# Patient Record
Sex: Female | Born: 1945 | Hispanic: No | Smoking: Never smoker
Health system: Southern US, Community
[De-identification: ages and names within clinical notes are randomized; demographics above are authoritative.]

## PROBLEM LIST (undated history)

## (undated) DIAGNOSIS — F319 Bipolar disorder, unspecified: Secondary | ICD-10-CM

## (undated) DIAGNOSIS — K219 Gastro-esophageal reflux disease without esophagitis: Secondary | ICD-10-CM

## (undated) DIAGNOSIS — G4700 Insomnia, unspecified: Secondary | ICD-10-CM

## (undated) DIAGNOSIS — R42 Dizziness and giddiness: Secondary | ICD-10-CM

## (undated) DIAGNOSIS — R569 Unspecified convulsions: Secondary | ICD-10-CM

## (undated) DIAGNOSIS — R4681 Obsessive-compulsive behavior: Secondary | ICD-10-CM

---

## 2011-04-22 ENCOUNTER — Emergency Department (HOSPITAL_BASED_OUTPATIENT_CLINIC_OR_DEPARTMENT_OTHER)
Admission: EM | Admit: 2011-04-22 | Discharge: 2011-04-22 | Disposition: A | Discharge status: Other Acute Inpatient Hospital | Payer: Medicare Other | Attending: Emergency Medicine | Admitting: Emergency Medicine

## 2011-04-22 ENCOUNTER — Emergency Department (INDEPENDENT_AMBULATORY_CARE_PROVIDER_SITE_OTHER): Payer: Medicare Other

## 2011-04-22 ENCOUNTER — Encounter (HOSPITAL_BASED_OUTPATIENT_CLINIC_OR_DEPARTMENT_OTHER): Payer: Self-pay | Admitting: *Deleted

## 2011-04-22 DIAGNOSIS — F429 Obsessive-compulsive disorder, unspecified: Secondary | ICD-10-CM | POA: Insufficient documentation

## 2011-04-22 DIAGNOSIS — R197 Diarrhea, unspecified: Secondary | ICD-10-CM

## 2011-04-22 DIAGNOSIS — R918 Other nonspecific abnormal finding of lung field: Secondary | ICD-10-CM

## 2011-04-22 DIAGNOSIS — R05 Cough: Secondary | ICD-10-CM

## 2011-04-22 DIAGNOSIS — J984 Other disorders of lung: Secondary | ICD-10-CM | POA: Insufficient documentation

## 2011-04-22 DIAGNOSIS — F319 Bipolar disorder, unspecified: Secondary | ICD-10-CM | POA: Insufficient documentation

## 2011-04-22 DIAGNOSIS — R109 Unspecified abdominal pain: Secondary | ICD-10-CM

## 2011-04-22 DIAGNOSIS — J189 Pneumonia, unspecified organism: Secondary | ICD-10-CM | POA: Insufficient documentation

## 2011-04-22 DIAGNOSIS — R4681 Obsessive-compulsive behavior: Secondary | ICD-10-CM | POA: Insufficient documentation

## 2011-04-22 DIAGNOSIS — G4700 Insomnia, unspecified: Secondary | ICD-10-CM | POA: Insufficient documentation

## 2011-04-22 HISTORY — DX: Obsessive-compulsive behavior: R46.81

## 2011-04-22 HISTORY — DX: Bipolar disorder, unspecified: F31.9

## 2011-04-22 HISTORY — DX: Dizziness and giddiness: R42

## 2011-04-22 HISTORY — DX: Insomnia, unspecified: G47.00

## 2011-04-22 LAB — DIFFERENTIAL
Basophils Absolute: 0 10*3/uL (ref 0.0–0.1)
Eosinophils Absolute: 0.3 10*3/uL (ref 0.0–0.7)
Lymphocytes Relative: 42 % (ref 12–46)
Monocytes Relative: 11 % (ref 3–12)
Neutrophils Relative %: 43 % (ref 43–77)

## 2011-04-22 LAB — CULTURE, BLOOD (ROUTINE X 2)
Culture  Setup Time: 201301131358
Culture: NO GROWTH

## 2011-04-22 LAB — CBC
HCT: 32.8 % — ABNORMAL LOW (ref 36.0–46.0)
MCHC: 32.3 g/dL (ref 30.0–36.0)
Platelets: 270 10*3/uL (ref 150–400)
RDW: 17.1 % — ABNORMAL HIGH (ref 11.5–15.5)
WBC: 6.7 10*3/uL (ref 4.0–10.5)

## 2011-04-22 LAB — BASIC METABOLIC PANEL
Calcium: 9 mg/dL (ref 8.4–10.5)
GFR calc Af Amer: >90 mL/min (ref >90)
GFR calc non Af Amer: 88 mL/min — ABNORMAL LOW (ref >90)
Potassium: 3.6 mEq/L (ref 3.5–5.1)
Sodium: 138 mEq/L (ref 135–145)

## 2011-04-22 MED ORDER — DIPHENHYDRAMINE HCL 50 MG/ML IJ SOLN
12.5000 mg | Freq: Once | INTRAMUSCULAR | Status: AC
  Administered 2011-04-22: 12.5 mg via INTRAVENOUS

## 2011-04-22 MED ORDER — DIPHENHYDRAMINE HCL 50 MG/ML IJ SOLN
INTRAMUSCULAR | Status: AC
  Administered 2011-04-22: 12.5 mg via INTRAVENOUS
  Filled 2011-04-22: qty 1

## 2011-04-22 MED ORDER — SODIUM CHLORIDE 0.9 % IV SOLN
INTRAVENOUS | Status: DC
  Administered 2011-04-22: 06:00:00 via INTRAVENOUS

## 2011-04-22 MED ORDER — DEXTROSE 5 % IV SOLN
500.0000 mg | Freq: Once | INTRAVENOUS | Status: AC
  Administered 2011-04-22: 500 mg via INTRAVENOUS
  Filled 2011-04-22: qty 500

## 2011-04-22 MED ORDER — GI COCKTAIL ~~LOC~~
30.0000 mL | Freq: Once | ORAL | Status: AC
  Administered 2011-04-22: 30 mL via ORAL
  Filled 2011-04-22: qty 30

## 2011-04-22 MED ORDER — PIPERACILLIN-TAZOBACTAM 3.375 G IVPB
3.3750 g | Freq: Once | INTRAVENOUS | Status: AC
  Administered 2011-04-22: 3.375 g via INTRAVENOUS
  Filled 2011-04-22: qty 50

## 2011-04-22 MED ORDER — VANCOMYCIN HCL IN DEXTROSE 1-5 GM/200ML-% IV SOLN
1000.0000 mg | Freq: Once | INTRAVENOUS | Status: AC
  Administered 2011-04-22: 1000 mg via INTRAVENOUS
  Filled 2011-04-22: qty 200

## 2011-04-22 NOTE — ED Provider Notes (Addendum)
History     CSN: [id number]  Arrival date & time 04/22/11  0110   First MD Initiated Contact with Patient 04/22/11 0146      Chief Complaint  Patient presents with  . Cough    (Consider location/radiation/quality/duration/timing/severity/associated sxs/prior treatment) Patient is a 66 y.o. female presenting with cough. The history is provided by a relative. The history is limited by a language barrier. No language interpreter was used.  Cough This is a recurrent problem. The current episode started more than 1 week ago (3 weeks ago.  Recently admitted at [facility] for Pna and then discharged and was [place]). The problem occurs constantly. The problem has not changed since onset.The cough is non-productive. There has been no fever. She has tried cough syrup for the symptoms. The treatment provided no relief. Risk factors include travel to endemic areas. Her past medical history is significant for pneumonia.    Past Medical History  Diagnosis Date  . Insomnia   . Vertigo   . Obsessive-compulsive behavior   . Bipolar 1 disorder     History reviewed. No pertinent past surgical history.  History reviewed. No pertinent family history.  History  Substance Use Topics  . Smoking status: Never Smoker   . Smokeless tobacco: Not on file  . Alcohol Use: No    OB History    Grav Para Term Preterm Abortions TAB SAB Ect Mult Living   4 4              Review of Systems  Unable to perform ROS Respiratory: Positive for cough.     Allergies  Review of patient's allergies indicates no known allergies.  Home Medications   Current Outpatient Rx  Name Route Sig Dispense Refill  . FLUOXETINE HCL 10 MG PO TABS Oral Take 10 mg by mouth daily.    Marland Kitchen LAMOTRIGINE 50 MG PO TBDP Oral Take by mouth.      BP 110/48  Pulse 74  Temp(Src) 97.8 F (36.6 C) (Oral)  Resp 17  SpO2 95%  Physical Exam  Constitutional: She appears well-developed.  HENT:  Head: Normocephalic and atraumatic.    Mouth/Throat: Oropharynx is clear and moist.  Eyes: Conjunctivae are normal. Pupils are equal, round, and reactive to light.  Neck: Normal range of motion. Neck supple.  Cardiovascular: Normal rate and regular rhythm.   Pulmonary/Chest: Effort normal and breath sounds normal. No stridor. She has no wheezes. She has no rales.  Abdominal: Soft. Bowel sounds are normal. There is no tenderness.  Neurological: She is alert.  Skin: Skin is warm and dry.    ED Course  Procedures (including critical care time)  Labs Reviewed  CBC - Abnormal; Notable for the following:    RBC 5.59 (*)    Hemoglobin 10.6 (*)    HCT 32.8 (*)    MCV 58.7 (*)    MCH 19.0 (*)    RDW 17.1 (*)    All other components within normal limits  BASIC METABOLIC PANEL - Abnormal; Notable for the following:    Glucose, Bld 100 (*)    GFR calc non Af Amer 88 (*)    All other components within normal limits  DIFFERENTIAL  CULTURE, BLOOD (ROUTINE X 2)  CULTURE, BLOOD (ROUTINE X 2)   Dg Chest 2 View  04/22/2011  *RADIOLOGY REPORT*  Clinical Data: Cough for 2-3 weeks; abdominal burning and diarrhea for 3 days.  CHEST - 2 VIEW  Comparison: None.  Findings: The lungs are well-aerated.  Mild focal airspace opacity is noted at the periphery of the right upper lobe, and there appears to be a small centrally lucent focus near the left lung apex, possibly reflecting mild central cavitation.  Would suggest clinical correlation for signs of Mycobacterium avium complex. Alternatively, this could reflect an unusual presentation of pneumonia, given apparent lingular opacity on the lateral view.  There is no evidence of pleural effusion or pneumothorax.  The heart is normal in size; the mediastinal contour is within normal limits.  No acute osseous abnormalities are seen.  IMPRESSION: Mild focal airspace opacities at the periphery of the right upper lobe and lingula, with apparent small centrally lucent focus near the left lung apex, possibly  reflecting mild central cavitation. Would suggest clinical correlation for signs of Mycobacterium avium complex; alternatively, this could reflect an unusual presentation of pneumonia.  Original Report Authenticated By: Tonia Ghent, M.D.     1. Pneumonia   2. Cavitary lesion of lung       MDM  Will treat for HCAP due to admission to [facility] within 90 days and cover for MAI.  Azithromycin added to vancomycin and zosyn Had redness and itching secondary azithromycin, azithromycin stopped secondary to same Original hospitalist at [facility] [place] declined due to beds but called back by nursing supervisor they have a bed available at 7 am the hospitalist is busy.  Will hold patient until that time  759 am  Dr.  [facility] agreed to accept patient in transfer   Tambi Thole Smitty Cords, MD 04/22/11 [phone]

## 2011-04-22 NOTE — ED Notes (Signed)
Pt's family has brought records from [place] and a recent admission from [facility] states that she has run out of her stomach meds and that burning in abd has been present x 5 years

## 2011-04-22 NOTE — ED Notes (Signed)
Pt was placed in negative pressure room, rm 11

## 2011-04-22 NOTE — ED Notes (Signed)
Per pt family pt has had a dry cough x 2-3 weeks family states that she has been taking OTC meds and is having burning in abd and diarrhea x 3 days

## 2011-04-22 NOTE — ED Notes (Signed)
Awaiting return call from [facility] for bed placement.

## 2011-08-07 ENCOUNTER — Emergency Department (HOSPITAL_BASED_OUTPATIENT_CLINIC_OR_DEPARTMENT_OTHER)
Admission: EM | Admit: 2011-08-07 | Discharge: 2011-08-08 | Disposition: A | Discharge status: Home / Self Care | Payer: Medicare Other | Attending: Emergency Medicine | Admitting: Emergency Medicine

## 2011-08-07 ENCOUNTER — Encounter (HOSPITAL_BASED_OUTPATIENT_CLINIC_OR_DEPARTMENT_OTHER): Payer: Self-pay | Admitting: Student

## 2011-08-07 ENCOUNTER — Emergency Department (INDEPENDENT_AMBULATORY_CARE_PROVIDER_SITE_OTHER): Payer: Medicare Other

## 2011-08-07 DIAGNOSIS — F319 Bipolar disorder, unspecified: Secondary | ICD-10-CM | POA: Insufficient documentation

## 2011-08-07 DIAGNOSIS — R509 Fever, unspecified: Secondary | ICD-10-CM | POA: Insufficient documentation

## 2011-08-07 DIAGNOSIS — R070 Pain in throat: Secondary | ICD-10-CM | POA: Insufficient documentation

## 2011-08-07 DIAGNOSIS — Z79899 Other long term (current) drug therapy: Secondary | ICD-10-CM | POA: Insufficient documentation

## 2011-08-07 DIAGNOSIS — R062 Wheezing: Secondary | ICD-10-CM | POA: Insufficient documentation

## 2011-08-07 DIAGNOSIS — M25539 Pain in unspecified wrist: Secondary | ICD-10-CM | POA: Insufficient documentation

## 2011-08-07 DIAGNOSIS — M7989 Other specified soft tissue disorders: Secondary | ICD-10-CM

## 2011-08-07 DIAGNOSIS — B9789 Other viral agents as the cause of diseases classified elsewhere: Secondary | ICD-10-CM | POA: Insufficient documentation

## 2011-08-07 DIAGNOSIS — R05 Cough: Secondary | ICD-10-CM

## 2011-08-07 DIAGNOSIS — J3489 Other specified disorders of nose and nasal sinuses: Secondary | ICD-10-CM | POA: Insufficient documentation

## 2011-08-07 DIAGNOSIS — R0602 Shortness of breath: Secondary | ICD-10-CM

## 2011-08-07 DIAGNOSIS — G40909 Epilepsy, unspecified, not intractable, without status epilepticus: Secondary | ICD-10-CM | POA: Insufficient documentation

## 2011-08-07 DIAGNOSIS — R059 Cough, unspecified: Secondary | ICD-10-CM | POA: Insufficient documentation

## 2011-08-07 DIAGNOSIS — R51 Headache: Secondary | ICD-10-CM | POA: Insufficient documentation

## 2011-08-07 DIAGNOSIS — IMO0001 Reserved for inherently not codable concepts without codable children: Secondary | ICD-10-CM | POA: Insufficient documentation

## 2011-08-07 DIAGNOSIS — B349 Viral infection, unspecified: Secondary | ICD-10-CM

## 2011-08-07 HISTORY — DX: Unspecified convulsions: R56.9

## 2011-08-07 MED ORDER — IBUPROFEN 800 MG PO TABS: 800.0000 mg | ORAL_TABLET | Freq: Once | ORAL | Status: DC

## 2011-08-07 MED ORDER — ALBUTEROL SULFATE HFA 108 (90 BASE) MCG/ACT IN AERS
2.0000 | INHALATION_SPRAY | RESPIRATORY_TRACT | Status: DC | PRN
  Administered 2011-08-07: 2 via RESPIRATORY_TRACT
  Filled 2011-08-07: qty 6.7

## 2011-08-07 NOTE — ED Provider Notes (Signed)
History     CSN: [id number]  Arrival date & time 08/07/11  2126   First MD Initiated Contact with Patient 08/07/11 2324      Chief Complaint  Patient presents with  . Influenza  . Wrist Pain    left wrist  . URI     Patient is a 66 y.o. female presenting with flu symptoms. The history is provided by the patient and a relative. A language interpreter was used.  Influenza This is a new problem. The current episode started more than 2 days ago. The problem occurs daily. The problem has been gradually worsening. The symptoms are aggravated by nothing. The symptoms are relieved by nothing. She has tried nothing for the symptoms. The treatment provided no relief.  Pt presents for cough, congestion, sore throat and headache for 3-4 days.  Myalgias are also reported She also reports pain to left wrist with bony prominence for "years" no recent trauma   Past Medical History  Diagnosis Date  . Insomnia   . Vertigo   . Obsessive-compulsive behavior   . Bipolar 1 disorder   . Seizures     History reviewed. No pertinent past surgical history.  History reviewed. No pertinent family history.  History  Substance Use Topics  . Smoking status: Never Smoker   . Smokeless tobacco: Not on file  . Alcohol Use: No    OB History    Grav Para Term Preterm Abortions TAB SAB Ect Mult Living   4 4              Review of Systems  Constitutional: Positive for fever.  Respiratory: Positive for cough.   All other systems reviewed and are negative.    Allergies  Review of patient's allergies indicates no known allergies.  Home Medications   Current Outpatient Rx  Name Route Sig Dispense Refill  . ACETAMINOPHEN 500 MG PO TABS Oral Take 1,000 mg by mouth every 6 (six) hours as needed. For pain    . IBUPROFEN 200 MG PO TABS Oral Take 400 mg by mouth every 6 (six) hours as needed. For pain    . ADULT MULTIVITAMIN W/MINERALS CH Oral Take 1 tablet by mouth daily.    Marland Kitchen OVER THE COUNTER  MEDICATION Oral Take 1 tablet by mouth 2 (two) times daily as needed. For gastric    . FLUOXETINE HCL 10 MG PO TABS Oral Take 10 mg by mouth daily.    Marland Kitchen LAMOTRIGINE 50 MG PO TBDP Oral Take by mouth.      BP 124/74  Pulse 78  Temp(Src) 98.1 F (36.7 C) (Oral)  Wt 165 lb (74.844 kg)  SpO2 98%  Physical Exam CONSTITUTIONAL: Well developed/well nourished HEAD AND FACE: Normocephalic/atraumatic EYES: EOMI/PERRL ENMT: Mucous membranes moist, uvula midline, pharynx normal, no oral lesions noted NECK: supple no meningeal signs SPINE:entire spine nontender CV: S1/S2 noted, no murmurs/rubs/gallops noted LUNGS: exp wheeze noted bilaterally, but no distress noted ABDOMEN: soft, nontender, no rebound or guarding GU:no cva tenderness NEURO: Pt is awake/alert, moves all extremitiesx4 EXTREMITIES: pulses normal, full ROM.  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  I feel this is not an acute issue and advised f/u as outpatient with PCP D/w patient and son, and also utilized phone interpreter.  Defer abx for now   The patient appears reasonably screened and/or stabilized for discharge and I doubt any other medical condition or other [facility] requiring further screening, evaluation, or treatment in the ED at this time prior to discharge.   MDM  Nursing notes reviewed and considered in documentation xrays reviewed and considered Previous records reviewed and considered         Joya Gaskins, MD 08/08/11 [phone]

## 2011-08-07 NOTE — ED Notes (Signed)
Flu like symptoms 3-4 days, afebrile at home, but feels like she has fever, + cough non productive

## 2011-08-07 NOTE — ED Notes (Signed)
Pt in wuth c/o flu like s/sx x 3-4 days and left wrist pain

## 2011-08-07 NOTE — ED Notes (Signed)
Per patients son she  Has a bony area to her left wrist which has been there for years, denies pain except when using wrist to ring out clothes, son requesting splint

## 2011-08-08 MED ORDER — HYDROCODONE-HOMATROPINE 5-1.5 MG/5ML PO SYRP: 2.5000 mL | ORAL_SOLUTION | Freq: Three times a day (TID) | ORAL | Status: AC | PRN

## 2011-08-08 NOTE — Discharge Instructions (Signed)
Please establish with primary physician to obtain further evaluation as we discussed  Antibiotic Nonuse  Your caregiver felt that the infection or problem was not one that would be helped with an antibiotic. Infections may be caused by viruses or bacteria. Only a caregiver can tell which one of these is the likely cause of an illness. A cold is the most common cause of infection in both adults and children. A cold is a virus. Antibiotic treatment will have no effect on a viral infection. Viruses can lead to many lost days of work caring for sick children and many missed days of school. Children may catch as many as 10 "colds" or "flus" per year during which they can be tearful, cranky, and uncomfortable. The goal of treating a virus is aimed at keeping the ill person comfortable. Antibiotics are medications used to help the body fight bacterial infections. There are relatively few types of bacteria that cause infections but there are hundreds of viruses. While both viruses and bacteria cause infection they are very different types of germs. A viral infection will typically go away by itself within 7 to 10 days. Bacterial infections may spread or get worse without antibiotic treatment. Examples of bacterial infections are:  Sore throats (like strep throat or tonsillitis).   Infection in the lung (pneumonia).   Ear and skin infections.  Examples of viral infections are:  Colds or flus.   Most coughs and bronchitis.   Sore throats not caused by Strep.   Runny noses.  It is often best not to take an antibiotic when a viral infection is the cause of the problem. Antibiotics can kill off the helpful bacteria that we have inside our body and allow harmful bacteria to start growing. Antibiotics can cause side effects such as allergies, nausea, and diarrhea without helping to improve the symptoms of the viral infection. Additionally, repeated uses of antibiotics can cause bacteria inside of our body to  become resistant. That resistance can be passed onto harmful bacterial. The next time you have an infection it may be harder to treat if antibiotics are used when they are not needed. Not treating with antibiotics allows our own immune system to develop and take care of infections more efficiently. Also, antibiotics will work better for [place] when they are prescribed for bacterial infections. Treatments for a child that is ill may include:  Give extra fluids throughout the day to stay hydrated.   Get plenty of rest.   Only give your child over-the-counter or prescription medicines for pain, discomfort, or fever as directed by your caregiver.   The use of a cool mist humidifier may help stuffy noses.   Cold medications if suggested by your caregiver.  Your caregiver may decide to start you on an antibiotic if:  The problem you were seen for today continues for a longer length of time than expected.   You develop a secondary bacterial infection.  SEEK MEDICAL CARE IF:  Fever lasts longer than 5 days.   Symptoms continue to get worse after 5 to 7 days or become severe.   Difficulty in breathing develops.   Signs of dehydration develop (poor drinking, rare urinating, dark colored urine).   Changes in behavior or worsening tiredness (listlessness or lethargy).  Document Released: 06/04/2001 Document Revised: 03/15/2011 Document Reviewed: 12/01/2008 [facility] Patient Information 2012 [place], [place].

## 2013-04-14 ENCOUNTER — Emergency Department (HOSPITAL_BASED_OUTPATIENT_CLINIC_OR_DEPARTMENT_OTHER): Payer: Medicare Other

## 2013-04-14 ENCOUNTER — Encounter (HOSPITAL_BASED_OUTPATIENT_CLINIC_OR_DEPARTMENT_OTHER): Payer: Self-pay | Admitting: Emergency Medicine

## 2013-04-14 ENCOUNTER — Emergency Department (HOSPITAL_BASED_OUTPATIENT_CLINIC_OR_DEPARTMENT_OTHER)
Admission: EM | Admit: 2013-04-14 | Discharge: 2013-04-14 | Disposition: A | Discharge status: Home / Self Care | Payer: Medicare Other | Attending: Emergency Medicine | Admitting: Emergency Medicine

## 2013-04-14 DIAGNOSIS — M549 Dorsalgia, unspecified: Secondary | ICD-10-CM

## 2013-04-14 DIAGNOSIS — R079 Chest pain, unspecified: Secondary | ICD-10-CM | POA: Insufficient documentation

## 2013-04-14 DIAGNOSIS — F319 Bipolar disorder, unspecified: Secondary | ICD-10-CM | POA: Insufficient documentation

## 2013-04-14 DIAGNOSIS — G40909 Epilepsy, unspecified, not intractable, without status epilepticus: Secondary | ICD-10-CM | POA: Insufficient documentation

## 2013-04-14 DIAGNOSIS — Z79899 Other long term (current) drug therapy: Secondary | ICD-10-CM | POA: Insufficient documentation

## 2013-04-14 DIAGNOSIS — F429 Obsessive-compulsive disorder, unspecified: Secondary | ICD-10-CM | POA: Insufficient documentation

## 2013-04-14 DIAGNOSIS — J069 Acute upper respiratory infection, unspecified: Secondary | ICD-10-CM

## 2013-04-14 LAB — URINE MICROSCOPIC-ADD ON

## 2013-04-14 LAB — URINALYSIS, ROUTINE W REFLEX MICROSCOPIC
Bilirubin Urine: NEGATIVE
GLUCOSE, UA: NEGATIVE mg/dL
Ketones, ur: NEGATIVE mg/dL
Leukocytes, UA: NEGATIVE
Nitrite: NEGATIVE
PROTEIN: NEGATIVE mg/dL
Specific Gravity, Urine: 1.012 (ref 1.005–1.030)
Urobilinogen, UA: 0.2 mg/dL (ref 0.0–1.0)
pH: 5.5 (ref 5.0–8.0)

## 2013-04-14 MED ORDER — MELOXICAM 7.5 MG PO TABS: 7.5000 mg | ORAL_TABLET | Freq: Every day | ORAL | Status: AC

## 2013-04-14 MED ORDER — HYDROCODONE-ACETAMINOPHEN 5-325 MG PO TABS: 2.0000 | ORAL_TABLET | ORAL | Status: AC | PRN

## 2013-04-14 MED ORDER — AZITHROMYCIN 250 MG PO TABS: 250.0000 mg | ORAL_TABLET | Freq: Every day | ORAL | Status: DC

## 2013-04-14 MED ORDER — FLUCONAZOLE 200 MG PO TABS: 200.0000 mg | ORAL_TABLET | Freq: Every day | ORAL | Status: AC

## 2013-04-14 NOTE — ED Provider Notes (Signed)
Medical screening examination/treatment/procedure(s) were performed by non-physician practitioner and as supervising physician I was immediately available for consultation/collaboration.  EKG Interpretation   None         Rolan BuccoMelanie Shell Yandow, MD 04/14/13 2320

## 2013-04-14 NOTE — ED Notes (Signed)
Explained to pt. Son that she needs a CT scan on her abd.  Due to blood noted in her urine.  Pt. Agreed to have this done.

## 2013-04-14 NOTE — Discharge Instructions (Signed)
Back Pain, Adult Low back pain is very common. About 1 in 5 people have back pain.The cause of low back pain is rarely dangerous. The pain often gets better over time.About half of people with a sudden onset of back pain feel better in just 2 weeks. About 8 in 10 people feel better by 6 weeks.  CAUSES Some common causes of back pain include:  Strain of the muscles or ligaments supporting the spine.  Wear and tear (degeneration) of the spinal discs.  Arthritis.  Direct injury to the back. DIAGNOSIS Most of the time, the direct cause of low back pain is not known.However, back pain can be treated effectively even when the exact cause of the pain is unknown.Answering your caregiver's questions about your overall health and symptoms is one of the most accurate ways to make sure the cause of your pain is not dangerous. If your caregiver needs more information, he or she may order lab work or imaging tests (X-rays or MRIs).However, even if imaging tests show changes in your back, this usually does not require surgery. HOME CARE INSTRUCTIONS For many people, back pain returns.Since low back pain is rarely dangerous, it is often a condition that people can learn to [facility] their own.   Remain active. It is stressful on the back to sit or stand in one place. Do not sit, drive, or stand in one place for more than 30 minutes at a time. Take short walks on level surfaces as soon as pain allows.Try to increase the length of time you walk each day.  Do not stay in bed.Resting more than 1 or 2 days can delay your recovery.  Do not avoid exercise or work.Your body is made to move.It is not dangerous to be active, even though your back may hurt.Your back will likely heal faster if you return to being active before your pain is gone.  Pay attention to your body when you bend and lift. Many people have less discomfortwhen lifting if they bend their knees, keep the load close to their bodies,and  avoid twisting. Often, the most comfortable positions are those that put less stress on your recovering back.  Find a comfortable position to sleep. Use a firm mattress and lie on your side with your knees slightly bent. If you lie on your back, put a pillow under your knees.  Only take over-the-counter or prescription medicines as directed by your caregiver. Over-the-counter medicines to reduce pain and inflammation are often the most helpful.Your caregiver may prescribe muscle relaxant drugs.These medicines help dull your pain so you can more quickly return to your normal activities and healthy exercise.  Put ice on the injured area.  Put ice in a plastic bag.  Place a towel between your skin and the bag.  Leave the ice on for 15-20 minutes, 03-04 times a day for the first 2 to 3 days. After that, ice and heat may be alternated to reduce pain and spasms.  Ask your caregiver about trying back exercises and gentle massage. This may be of some benefit.  Avoid feeling anxious or stressed.Stress increases muscle tension and can worsen back pain.It is important to recognize when you are anxious or stressed and learn ways to manage it.Exercise is a great option. SEEK MEDICAL CARE IF:  You have pain that is not relieved with rest or medicine.  You have pain that does not improve in 1 week.  You have new symptoms.  You are generally not feeling well. SEEK  IMMEDIATE MEDICAL CARE IF:   You have pain that radiates from your back into your legs.  You develop new bowel or bladder control problems.  You have unusual weakness or numbness in your arms or legs.  You develop nausea or vomiting.  You develop abdominal pain.  You feel faint. Document Released: 03/26/2005 Document Revised: 09/25/2011 Document Reviewed: 08/14/2010 [facility] Patient Information 2014 [place], [place]. Bronchitis Bronchitis is the body's way of reacting to injury and/or infection (inflammation) of the bronchi.  Bronchi are the air tubes that extend from the windpipe into the lungs. If the inflammation becomes severe, it may cause shortness of breath. CAUSES  Inflammation may be caused by:  A virus.  Germs (bacteria).  Dust.  Allergens.  Pollutants and many other irritants. The cells lining the bronchial tree are covered with tiny hairs (cilia). These constantly beat upward, away from the lungs, toward the mouth. This keeps the lungs free of pollutants. When these cells become too irritated and are unable to do their job, mucus begins to develop. This causes the characteristic cough of bronchitis. The cough clears the lungs when the cilia are unable to do their job. Without either of these protective mechanisms, the mucus would settle in the lungs. Then you would develop pneumonia. Smoking is a common cause of bronchitis and can contribute to pneumonia. Stopping this habit is the single most important thing you can do to help yourself. TREATMENT   Your caregiver may prescribe an antibiotic if the cough is caused by bacteria. Also, medicines that open up your airways make it easier to breathe. Your caregiver may also recommend or prescribe an expectorant. It will loosen the mucus to be coughed up. Only take over-the-counter or prescription medicines for pain, discomfort, or fever as directed by your caregiver.  Removing whatever causes the problem (smoking, for example) is critical to preventing the problem from getting worse.  Cough suppressants may be prescribed for relief of cough symptoms.  Inhaled medicines may be prescribed to help with symptoms now and to help prevent problems from returning.  For those with recurrent (chronic) bronchitis, there may be a need for steroid medicines. SEEK IMMEDIATE MEDICAL CARE IF:   During treatment, you develop more pus-like mucus (purulent sputum).  You have a fever.  You become progressively more ill.  You have increased difficulty breathing,  wheezing, or shortness of breath. It is necessary to seek immediate medical care if you are elderly or sick from any other disease. MAKE SURE YOU:   Understand these instructions.  Will watch your condition.  Will get help right away if you are not doing well or get worse. Document Released: 03/26/2005 Document Revised: 11/26/2012 Document Reviewed: 11/18/2012 [facility] Patient Information 2014 [place], [place].

## 2013-04-14 NOTE — ED Notes (Signed)
Dry cough, rib pain, body aches and headache x 2 days.

## 2013-04-14 NOTE — ED Notes (Signed)
Patient transported to X-ray 

## 2013-04-14 NOTE — ED Provider Notes (Signed)
CSN: [id number]     Arrival date & time 04/14/13  1653 History   First MD Initiated Contact with Patient 04/14/13 1918     Chief Complaint  Patient presents with  . Cough   (Consider location/radiation/quality/duration/timing/severity/associated sxs/prior Treatment) Patient is a 68 y.o. female presenting with chest pain. The history is provided by the patient. No language interpreter was used.  Chest Pain Pain location:  L chest and R chest Pain quality: aching   Pain radiates to:  Does not radiate Pain severity:  Moderate Onset quality:  Gradual Duration:  2 days Timing:  Constant Progression:  Worsening Chronicity:  New Relieved by:  Nothing Worsened by:  Nothing tried Ineffective treatments:  None tried Pt complains of itching in groin area,  Coughing   Past Medical History  Diagnosis Date  . Insomnia   . Vertigo   . Obsessive-compulsive behavior   . Bipolar 1 disorder   . Seizures    History reviewed. No pertinent past surgical history. No family history on file. History  Substance Use Topics  . Smoking status: Never Smoker   . Smokeless tobacco: Not on file  . Alcohol Use: No   OB History   Grav Para Term Preterm Abortions TAB SAB Ect Mult Living   4 4             Review of Systems  Cardiovascular: Positive for chest pain.  All other systems reviewed and are negative.    Allergies  Review of patient's allergies indicates no known allergies.  Home Medications   Current Outpatient Rx  Name  Route  Sig  Dispense  Refill  . acetaminophen (TYLENOL) 500 MG tablet   Oral   Take 1,000 mg by mouth every 6 (six) hours as needed. For pain         . FLUoxetine (PROZAC) 10 MG tablet   Oral   Take 10 mg by mouth daily.         Marland Kitchen. ibuprofen (ADVIL,MOTRIN) 200 MG tablet   Oral   Take 400 mg by mouth every 6 (six) hours as needed. For pain         . LamoTRIgine (LAMICTAL ODT) 50 MG TBDP   Oral   Take by mouth.         . Multiple Vitamin  (MULITIVITAMIN WITH MINERALS) TABS   Oral   Take 1 tablet by mouth daily.         Marland Kitchen. OVER THE COUNTER MEDICATION   Oral   Take 1 tablet by mouth 2 (two) times daily as needed. For gastric          BP 129/65  Pulse 84  Temp(Src) 98 F (36.7 C) (Oral)  Resp 20  Wt 165 lb (74.844 kg)  SpO2 99% Physical Exam  Nursing note and vitals reviewed. Constitutional: She is oriented to person, place, and time. She appears well-developed and well-nourished.  HENT:  Head: Normocephalic.  Eyes: Pupils are equal, round, and reactive to light.  Neck: Normal range of motion.  Cardiovascular: Normal rate, regular rhythm and normal heart sounds.   Pulmonary/Chest: Effort normal.  Abdominal: Soft.  Musculoskeletal: Normal range of motion.  Neurological: She is alert and oriented to person, place, and time. She has normal reflexes.  Skin: Skin is warm.  Psychiatric: She has a normal mood and affect.    ED Course  Procedures (including critical care time) Labs Review Labs Reviewed  URINALYSIS, ROUTINE W REFLEX MICROSCOPIC - Abnormal; Notable for the  following:    Hgb urine dipstick MODERATE (*)    All other components within normal limits  URINE MICROSCOPIC-ADD ON   Imaging Review No results found.  EKG Interpretation   None       MDM blood in urine     1. Back pain   2. URI, acute    Zithromax, hydrocodone for bronchitis.   meloxicam for chronic arthritis pain.   Pt given diflucan     Elson Areas, PA-C 04/14/13 2314

## 2014-02-08 ENCOUNTER — Encounter (HOSPITAL_BASED_OUTPATIENT_CLINIC_OR_DEPARTMENT_OTHER): Payer: Self-pay | Admitting: Emergency Medicine

## 2015-07-29 ENCOUNTER — Encounter (HOSPITAL_BASED_OUTPATIENT_CLINIC_OR_DEPARTMENT_OTHER): Payer: Self-pay | Admitting: Emergency Medicine

## 2015-07-29 ENCOUNTER — Emergency Department (HOSPITAL_BASED_OUTPATIENT_CLINIC_OR_DEPARTMENT_OTHER): Payer: Medicare Other

## 2015-07-29 ENCOUNTER — Emergency Department (HOSPITAL_BASED_OUTPATIENT_CLINIC_OR_DEPARTMENT_OTHER)
Admission: EM | Admit: 2015-07-29 | Discharge: 2015-07-29 | Disposition: A | Discharge status: Home / Self Care | Payer: Medicare Other | Attending: Emergency Medicine | Admitting: Emergency Medicine

## 2015-07-29 DIAGNOSIS — F319 Bipolar disorder, unspecified: Secondary | ICD-10-CM | POA: Insufficient documentation

## 2015-07-29 DIAGNOSIS — R51 Headache: Secondary | ICD-10-CM | POA: Insufficient documentation

## 2015-07-29 DIAGNOSIS — R0602 Shortness of breath: Secondary | ICD-10-CM | POA: Insufficient documentation

## 2015-07-29 DIAGNOSIS — M2550 Pain in unspecified joint: Secondary | ICD-10-CM | POA: Diagnosis not present

## 2015-07-29 DIAGNOSIS — Z79899 Other long term (current) drug therapy: Secondary | ICD-10-CM | POA: Diagnosis not present

## 2015-07-29 HISTORY — DX: Gastro-esophageal reflux disease without esophagitis: K21.9

## 2015-07-29 LAB — CBC WITH DIFFERENTIAL/PLATELET
Basophils Absolute: 0.1 10*3/uL (ref 0.0–0.1)
Basophils Relative: 1 %
Eosinophils Absolute: 0.2 10*3/uL (ref 0.0–0.7)
Eosinophils Relative: 3 %
HCT: 33.5 % — ABNORMAL LOW (ref 36.0–46.0)
Hemoglobin: 11.1 g/dL — ABNORMAL LOW (ref 12.0–15.0)
Lymphocytes Relative: 34 %
Lymphs Abs: 2.2 10*3/uL (ref 0.7–4.0)
MCH: 19.4 pg — ABNORMAL LOW (ref 26.0–34.0)
MCHC: 33.1 g/dL (ref 30.0–36.0)
MCV: 58.5 fL — ABNORMAL LOW (ref 78.0–100.0)
Monocytes Absolute: 0.7 10*3/uL (ref 0.1–1.0)
Monocytes Relative: 10 %
Neutro Abs: 3.4 10*3/uL (ref 1.7–7.7)
Neutrophils Relative %: 52 %
Platelets: 262 10*3/uL (ref 150–400)
RBC: 5.73 MIL/uL — ABNORMAL HIGH (ref 3.87–5.11)
RDW: 18.7 % — ABNORMAL HIGH (ref 11.5–15.5)
WBC: 6.6 10*3/uL (ref 4.0–10.5)

## 2015-07-29 LAB — COMPREHENSIVE METABOLIC PANEL
ALT: 15 U/L (ref 14–54)
AST: 17 U/L (ref 15–41)
Albumin: 3.8 g/dL (ref 3.5–5.0)
Alkaline Phosphatase: 77 U/L (ref 38–126)
Anion gap: 5 (ref 5–15)
BUN: 15 mg/dL (ref 6–20)
CO2: 27 mmol/L (ref 22–32)
Calcium: 9 mg/dL (ref 8.9–10.3)
Chloride: 106 mmol/L (ref 101–111)
Creatinine, Ser: 0.87 mg/dL (ref 0.44–1.00)
GFR calc Af Amer: >60 mL/min (ref >60)
GFR calc non Af Amer: >60 mL/min (ref >60)
Glucose, Bld: 80 mg/dL (ref 65–99)
Potassium: 4.1 mmol/L (ref 3.5–5.1)
Sodium: 138 mmol/L (ref 135–145)
Total Bilirubin: 0.5 mg/dL (ref 0.3–1.2)
Total Protein: 7.5 g/dL (ref 6.5–8.1)

## 2015-07-29 LAB — BRAIN NATRIURETIC PEPTIDE: B Natriuretic Peptide: 36.2 pg/mL (ref 0.0–100.0)

## 2015-07-29 LAB — TROPONIN I: Troponin I: <0.03 ng/mL (ref <0.031)

## 2015-07-29 MED ORDER — AZITHROMYCIN 250 MG PO TABS: ORAL_TABLET | ORAL | Status: DC

## 2015-07-29 MED ORDER — BENZONATATE 100 MG PO CAPS: 100.0000 mg | ORAL_CAPSULE | Freq: Three times a day (TID) | ORAL | Status: AC

## 2015-07-29 MED ORDER — ALBUTEROL SULFATE HFA 108 (90 BASE) MCG/ACT IN AERS
1.0000 | INHALATION_SPRAY | RESPIRATORY_TRACT | Status: DC | PRN
  Administered 2015-07-29: 2 via RESPIRATORY_TRACT
  Filled 2015-07-29: qty 6.7

## 2015-07-29 NOTE — ED Provider Notes (Signed)
CSN: [id number]     Arrival date & time 07/29/15  1719 History   First MD Initiated Contact with Patient 07/29/15 1730     Chief Complaint  Patient presents with  . Shortness of Breath     (Consider location/radiation/quality/duration/timing/severity/associated sxs/prior Treatment) HPI Comments: Patient is a 70 year old female with history of GERD who presents with shortness of breath. The patient began feeling shortness of breath at rest, worse with walking since Sunday. The patient has had an associated inferior sternal, epigastric pain intermittently since onset. This pain is not pleuritic. She denies cough, hemoptysis. Her son states she has been coughing to some degree. Patient has not taken any medications at home for this. Patient also reports a chronic, daily frontal headache that occurs when she is cold or drinks anything that is cold. This has been occurring for years. Patient also reports bilateral flank pain that has been going on for months. Patient also reports pain in her lower extremity joints after walking or standing a lot. Patient takes ibuprofen and Tylenol for the headaches and joint pain that improves temporarily. Patient denies numbness, paresthesias, abdominal pain, nausea, vomiting, dysuria.  Patient is a 70 y.o. female presenting with shortness of breath. The history is provided by a relative. The history is limited by a language barrier.  Shortness of Breath Associated symptoms: cough and headaches   Associated symptoms: no abdominal pain, no chest pain, no fever, no rash, no sore throat, no vomiting and no wheezing     Past Medical History  Diagnosis Date  . Insomnia   . Vertigo   . Obsessive-compulsive behavior   . Bipolar 1 disorder (HCC)   . Seizures (HCC)   . GERD (gastroesophageal reflux disease)    History reviewed. No pertinent past surgical history. History reviewed. No pertinent family history. Social History  Substance Use Topics  . Smoking status:  Never Smoker   . Smokeless tobacco: None  . Alcohol Use: No   OB History    Gravida Para Term Preterm AB TAB SAB Ectopic Multiple Living   4 4             Review of Systems  Constitutional: Negative for fever and chills.  HENT: Negative for facial swelling and sore throat.   Respiratory: Positive for cough and shortness of breath. Negative for wheezing and stridor.   Cardiovascular: Negative for chest pain and leg swelling.  Gastrointestinal: Negative for nausea, vomiting and abdominal pain.  Genitourinary: Negative for dysuria.  Musculoskeletal: Positive for arthralgias. Negative for back pain.  Skin: Negative for rash and wound.  Neurological: Positive for headaches.  Psychiatric/Behavioral: The patient is not nervous/anxious.       Allergies  Review of patient's allergies indicates no known allergies.  Home Medications   Prior to Admission medications   Medication Sig Start Date End Date Taking? Authorizing Provider  omeprazole (PRILOSEC) 10 MG capsule Take 10 mg by mouth daily.   Yes Historical Provider, MD  acetaminophen (TYLENOL) 500 MG tablet Take 1,000 mg by mouth every 6 (six) hours as needed. For pain    Historical Provider, MD  azithromycin (ZITHROMAX) 250 MG tablet Take 2 tablets the first day, take one tablet on days 2-5. 07/29/15   Emi Holes, PA-C  benzonatate (TESSALON) 100 MG capsule Take 1 capsule (100 mg total) by mouth every 8 (eight) hours. 07/29/15   Emi Holes, PA-C  FLUoxetine (PROZAC) 10 MG tablet Take 10 mg by mouth daily.    Historical  Provider, MD  HYDROcodone-acetaminophen (NORCO/VICODIN) 5-325 MG per tablet Take 2 tablets by mouth every 4 (four) hours as needed. 04/14/13   Elson Areas, PA-C  ibuprofen (ADVIL,MOTRIN) 200 MG tablet Take 400 mg by mouth every 6 (six) hours as needed. For pain    Historical Provider, MD  LamoTRIgine (LAMICTAL ODT) 50 MG TBDP Take by mouth.    Historical Provider, MD  meloxicam (MOBIC) 7.5 MG tablet Take 1  tablet (7.5 mg total) by mouth daily. 04/14/13   Elson Areas, PA-C  Multiple Vitamin (MULITIVITAMIN WITH MINERALS) TABS Take 1 tablet by mouth daily.    Historical Provider, MD  OVER THE COUNTER MEDICATION Take 1 tablet by mouth 2 (two) times daily as needed. For gastric    Historical Provider, MD   BP 134/76 mmHg  Pulse 64  Temp(Src) 98 F (36.7 C) (Oral)  Resp 17  Ht  (1.575 m)  Wt 74.844 kg  BMI 30.17 kg/m2  SpO2 99% Physical Exam  Constitutional: She appears well-developed and well-nourished. No distress.  HENT:  Head: Normocephalic and atraumatic.  Mouth/Throat: Oropharynx is clear and moist. No oropharyngeal exudate.  Eyes: Conjunctivae and EOM are normal. Pupils are equal, round, and reactive to light. Right eye exhibits no discharge. Left eye exhibits no discharge. No scleral icterus.  Left pupil is teardrop shaped, it is unknown to patient or family whether this is congenital  Neck: Normal range of motion. Neck supple. No thyromegaly present.  Cardiovascular: Normal rate, regular rhythm, normal heart sounds and intact distal pulses.  Exam reveals no gallop and no friction rub.   No murmur heard. Pulmonary/Chest: Effort normal and breath sounds normal. No stridor. No respiratory distress. She has no wheezes. She has no rales. She exhibits no tenderness.  Abdominal: Soft. Bowel sounds are normal. She exhibits no distension. There is no tenderness. There is no rebound and no guarding.  Reports intermittent pain in the epigastric area, it is nontender on palpation  Musculoskeletal: She exhibits no edema.  Lymphadenopathy:    She has no cervical adenopathy.  Neurological: She is alert. Coordination normal.  CN 3-12 intact, normal sensation and 5/5 strength throughout  Skin: Skin is warm and dry. No rash noted. She is not diaphoretic. No pallor.  Psychiatric: She has a normal mood and affect.  Nursing note and vitals reviewed.   ED Course  Procedures (including critical  care time) Labs Review Labs Reviewed  CBC WITH DIFFERENTIAL/PLATELET - Abnormal; Notable for the following:    RBC 5.73 (*)    Hemoglobin 11.1 (*)    HCT 33.5 (*)    MCV 58.5 (*)    MCH 19.4 (*)    RDW 18.7 (*)    All other components within normal limits  COMPREHENSIVE METABOLIC PANEL  BRAIN NATRIURETIC PEPTIDE  TROPONIN I    Imaging Review Dg Chest 2 View  07/29/2015  CLINICAL DATA:  Shortness of breath.  Headache. EXAM: CHEST  2 VIEW COMPARISON:  04/14/2013 FINDINGS: Patchy biapical pleural parenchymal scarring not appreciably changed from the prior exam. The lungs appear otherwise clear. Cardiac and mediastinal margins appear normal. No pleural effusion.  Scarring in the lingula. IMPRESSION: 1. Stable appearance of biapical pleural parenchymal scarring and lingular scarring. No acute findings. Electronically Signed   By: Gaylyn Rong M.D.   On: 07/29/2015 18:55   I have personally reviewed and evaluated these images and lab results as part of my medical decision-making.   EKG Interpretation   Date/Time:  Friday July 29 2015 18:04:05 EDT Ventricular Rate:  71 PR Interval:  195 QRS Duration: 101 QT Interval:  380 QTC Calculation: 413 R Axis:   30 Text Interpretation:  Sinus rhythm Low voltage, precordial leads Confirmed  by Fayrene FearingJAMES  MD, MARK ([id number]) on 07/29/2015 8:12:58 PM      MDM   Pulse ox averaging 98-100% on room air throughout ED course. CMP unremarkable CBC shows anemia with hemoglobin 11.1 which is elevated from last results in 2013. BNP 36.2. Troponin less than 0.03. Chest x-ray shows stable appearance of by a vehicle pleural breakable scarring and lingular scarring, but no acute findings.EKG shows NSR and low-voltage. Discharge patient with azithromycin, Tessalon, and albuterol inhaler. Patient advised to follow-up with primary care provider within the week for recheck, to establish care, and treat chronic conditions. Patient and family understand and agree  with plan. Patient discussed with Dr. Rolland PorterMark James who is in agreement with plan.  Final diagnoses:  Shortness of breath       Emi Holeslexandra M Zyon Rosser, PA-C 07/30/15 0017  Rolland PorterMark James, MD 08/09/15 [phone]

## 2015-07-29 NOTE — Discharge Instructions (Signed)
Medications: Albuterol inhaler, azithromycin, Tessalon  Treatment: Take azithromycin as prescribed. Take Tessalon every 8 hours as needed for cough. Use albuterol inhaler every 4 hours as needed for shortness of breath.  Follow-up: Please follow-up with the primary care provider listed on the discharge instructions for follow-up this week and for further evaluation of your chronic pain. Please return the emergency Department if you have worsening shortness of breath, or develop any new or concerning symptoms.   Shortness of Breath Shortness of breath means you have trouble breathing. It could also mean that you have a medical problem. You should get immediate medical care for shortness of breath. CAUSES   Not enough oxygen in the air such as with high altitudes or a smoke-filled room.  Certain lung diseases, infections, or problems.  Heart disease or conditions, such as angina or heart failure.  Low red blood cells (anemia).  Poor physical fitness, which can cause shortness of breath when you exercise.  Chest or back injuries or stiffness.  Being overweight.  Smoking.  Anxiety, which can make you feel like you are not getting enough air. DIAGNOSIS  Serious medical problems can often be found during your physical exam. Tests may also be done to determine why you are having shortness of breath. Tests may include:  Chest X-rays.  Lung function tests.  Blood tests.  An electrocardiogram (ECG).  An ambulatory electrocardiogram. An ambulatory ECG records your heartbeat patterns over a 24-hour period.  Exercise testing.  A transthoracic echocardiogram (TTE). During echocardiography, sound waves are used to evaluate how blood flows through your heart.  A transesophageal echocardiogram (TEE).  Imaging scans. Your health care provider may not be able to find a cause for your shortness of breath after your exam. In this case, it is important to have a follow-up exam with your  health care provider as directed.  TREATMENT  Treatment for shortness of breath depends on the cause of your symptoms and can vary greatly. HOME CARE INSTRUCTIONS   Do not smoke. Smoking is a common cause of shortness of breath. If you smoke, ask for help to quit.  Avoid being around chemicals or things that may bother your breathing, such as paint fumes and dust.  Rest as needed. Slowly resume your usual activities.  If medicines were prescribed, take them as directed for the full length of time directed. This includes oxygen and any inhaled medicines.  Keep all follow-up appointments as directed by your health care provider. SEEK MEDICAL CARE IF:   Your condition does not improve in the time expected.  You have a hard time doing your normal activities even with rest.  You have any new symptoms. SEEK IMMEDIATE MEDICAL CARE IF:   Your shortness of breath gets worse.  You feel light-headed, faint, or develop a cough not controlled with medicines.  You start coughing up blood.  You have pain with breathing.  You have chest pain or pain in your arms, shoulders, or abdomen.  You have a fever.  You are unable to walk up stairs or exercise the way you normally do. MAKE SURE YOU:  Understand these instructions.  Will watch your condition.  Will get help right away if you are not doing well or get worse.   This information is not intended to replace advice given to you by your health care provider. Make sure you discuss any questions you have with your health care provider.   Document Released: 12/19/2000 Document Revised: 03/31/2013 Document Reviewed: 06/11/2011 Elsevier  Interactive Patient Education [employer].

## 2015-07-29 NOTE — ED Notes (Signed)
Patient has had SOB since Sunday where her family member states that she gasps for breath and has a hard time catching her breath. The pateint also has Headache, fatigue and other multiple complaints that she has had for "months" to a "while". Patient is in no distress in triage and talking full sentences.

## 2015-09-28 ENCOUNTER — Encounter (HOSPITAL_COMMUNITY): Payer: Self-pay

## 2015-09-28 ENCOUNTER — Emergency Department (HOSPITAL_COMMUNITY)
Admission: EM | Admit: 2015-09-28 | Discharge: 2015-09-29 | Disposition: A | Discharge status: Home / Self Care | Payer: Medicare Other | Attending: Emergency Medicine | Admitting: Emergency Medicine

## 2015-09-28 DIAGNOSIS — F319 Bipolar disorder, unspecified: Secondary | ICD-10-CM | POA: Insufficient documentation

## 2015-09-28 DIAGNOSIS — R109 Unspecified abdominal pain: Secondary | ICD-10-CM | POA: Diagnosis present

## 2015-09-28 DIAGNOSIS — Z791 Long term (current) use of non-steroidal anti-inflammatories (NSAID): Secondary | ICD-10-CM | POA: Diagnosis not present

## 2015-09-28 DIAGNOSIS — Z8669 Personal history of other diseases of the nervous system and sense organs: Secondary | ICD-10-CM | POA: Diagnosis not present

## 2015-09-28 DIAGNOSIS — J189 Pneumonia, unspecified organism: Secondary | ICD-10-CM | POA: Diagnosis not present

## 2015-09-28 DIAGNOSIS — Z79899 Other long term (current) drug therapy: Secondary | ICD-10-CM | POA: Insufficient documentation

## 2015-09-28 DIAGNOSIS — R52 Pain, unspecified: Secondary | ICD-10-CM

## 2015-09-28 DIAGNOSIS — M791 Myalgia, unspecified site: Secondary | ICD-10-CM

## 2015-09-28 NOTE — ED Notes (Signed)
Pt returned from [place] yesterday and while she was there she began to have a cough and general body aches for about one week, now her feet are swollen and it's hard for her to walk

## 2015-09-29 ENCOUNTER — Emergency Department (HOSPITAL_COMMUNITY): Payer: Medicare Other

## 2015-09-29 DIAGNOSIS — J189 Pneumonia, unspecified organism: Secondary | ICD-10-CM | POA: Diagnosis not present

## 2015-09-29 LAB — CBC WITH DIFFERENTIAL/PLATELET
BASOS PCT: 0 %
Basophils Absolute: 0 10*3/uL (ref 0.0–0.1)
Eosinophils Absolute: 0.1 10*3/uL (ref 0.0–0.7)
Eosinophils Relative: 3 %
HEMATOCRIT: 31.9 % — AB (ref 36.0–46.0)
HEMOGLOBIN: 10 g/dL — AB (ref 12.0–15.0)
LYMPHS ABS: 2.2 10*3/uL (ref 0.7–4.0)
LYMPHS PCT: 49 %
MCH: 18.8 pg — AB (ref 26.0–34.0)
MCHC: 31.3 g/dL (ref 30.0–36.0)
MCV: 59.8 fL — AB (ref 78.0–100.0)
Monocytes Absolute: 0.5 10*3/uL (ref 0.1–1.0)
Monocytes Relative: 10 %
NEUTROS ABS: 1.7 10*3/uL (ref 1.7–7.7)
Neutrophils Relative %: 38 %
PLATELETS: 253 10*3/uL (ref 150–400)
RBC: 5.33 MIL/uL — ABNORMAL HIGH (ref 3.87–5.11)
RDW: 16.3 % — AB (ref 11.5–15.5)
WBC: 4.5 10*3/uL (ref 4.0–10.5)

## 2015-09-29 LAB — PROTIME-INR
INR: 0.96 (ref 0.00–1.49)
PROTHROMBIN TIME: 13 s (ref 11.6–15.2)

## 2015-09-29 LAB — COMPREHENSIVE METABOLIC PANEL
ALK PHOS: 70 U/L (ref 38–126)
ALT: 20 U/L (ref 14–54)
AST: 28 U/L (ref 15–41)
Albumin: 3.8 g/dL (ref 3.5–5.0)
Anion gap: 7 (ref 5–15)
BUN: 11 mg/dL (ref 6–20)
CALCIUM: 8.7 mg/dL — AB (ref 8.9–10.3)
CHLORIDE: 106 mmol/L (ref 101–111)
CO2: 24 mmol/L (ref 22–32)
CREATININE: 0.78 mg/dL (ref 0.44–1.00)
Glucose, Bld: 92 mg/dL (ref 65–99)
Potassium: 4.3 mmol/L (ref 3.5–5.1)
Sodium: 137 mmol/L (ref 135–145)
Total Bilirubin: 0.4 mg/dL (ref 0.3–1.2)
Total Protein: 7.6 g/dL (ref 6.5–8.1)

## 2015-09-29 LAB — TROPONIN I

## 2015-09-29 LAB — BRAIN NATRIURETIC PEPTIDE: B Natriuretic Peptide: 58.3 pg/mL (ref 0.0–100.0)

## 2015-09-29 MED ORDER — AZITHROMYCIN 250 MG PO TABS
250.0000 mg | ORAL_TABLET | Freq: Every day | ORAL | Status: AC
Start: 2015-09-29 — End: ?

## 2015-09-29 MED ORDER — SODIUM CHLORIDE 0.9 % IV BOLUS (SEPSIS)
1000.0000 mL | Freq: Once | INTRAVENOUS | Status: AC
  Administered 2015-09-29: 1000 mL via INTRAVENOUS

## 2015-09-29 MED ORDER — AMOXICILLIN 500 MG PO CAPS: 1000.0000 mg | ORAL_CAPSULE | Freq: Three times a day (TID) | ORAL | Status: AC

## 2015-09-29 MED ORDER — ACETAMINOPHEN-CODEINE #3 300-30 MG PO TABS: 1.0000 | ORAL_TABLET | Freq: Four times a day (QID) | ORAL | Status: AC | PRN

## 2015-09-29 NOTE — ED Provider Notes (Signed)
CSN: [id number]     Arrival date & time 09/28/15  2245 History  By signing my name below, I, Phillis HaggisGabriella Gaje, attest that this documentation has been prepared under the direction and in the presence of Derwood KaplanAnkit Kathy Wares, MD. Electronically Signed: Phillis HaggisGabriella Gaje, ED Scribe. 09/29/2015. 1:59 AM.   Chief Complaint  Patient presents with  . Generalized Body Aches   The history is provided by the patient. No language interpreter was used.  HPI Comments: Ramon DredgeFnu Coba is a 70 y.o. Female with a hx of GERD and seizures who presents to the Emergency Department complaining of gradually worsening, dry cough onset one week ago. Pt recently returned from [place] after a two week trip. Son states that pt had a mild cough prior to leaving, but it continued to get worse while she was there despite taking medication. Pt began to have generalized myalgias, worse in the lower extremities, and subjective fever after the first day of her trip. Son states that pt did a lot of walking daily on her trip. She was taking Ibuprofen for her symptoms to relief. Pt returned last night and had a fever of 102 F. She reports associated swelling to the bilateral feet and SOB that worsens with walking. She reports worsening pain with ambulation. Pt has been fasting for religous purposes. Son reports pt broke her left foot about 8 years ago and it still hurts her intermittently. She denies chest pain or sore throat. Pt does not have a PCP.   Past Medical History  Diagnosis Date  . Insomnia   . Vertigo   . Obsessive-compulsive behavior   . Bipolar 1 disorder (HCC)   . Seizures (HCC)   . GERD (gastroesophageal reflux disease)    History reviewed. No pertinent past surgical history. History reviewed. No pertinent family history. Social History  Substance Use Topics  . Smoking status: Never Smoker   . Smokeless tobacco: None  . Alcohol Use: No   OB History    Gravida Para Term Preterm AB TAB SAB Ectopic Multiple Living   4  4             Review of Systems 10 Systems reviewed and all are negative for acute change except as noted in the HPI.  Allergies  Review of patient's allergies indicates no known allergies.  Home Medications   Prior to Admission medications   Medication Sig Start Date End Date Taking? Authorizing Provider  acetaminophen (TYLENOL) 500 MG tablet Take 1,000 mg by mouth every 6 (six) hours as needed. For pain   Yes Historical Provider, MD  benzonatate (TESSALON) 100 MG capsule Take 1 capsule (100 mg total) by mouth every 8 (eight) hours. 07/29/15  Yes Alexandra M Law, PA-C  ibuprofen (ADVIL,MOTRIN) 200 MG tablet Take 400 mg by mouth every 6 (six) hours as needed. For pain   Yes Historical Provider, MD  omeprazole (PRILOSEC OTC) 20 MG tablet Take 20 mg by mouth daily.   Yes Historical Provider, MD  azithromycin (ZITHROMAX) 250 MG tablet Take 2 tablets the first day, take one tablet on days 2-5. Patient not taking: Reported on 09/29/2015 07/29/15   Emi HolesAlexandra M Law, PA-C  HYDROcodone-acetaminophen (NORCO/VICODIN) 5-325 MG per tablet Take 2 tablets by mouth every 4 (four) hours as needed. Patient not taking: Reported on 09/29/2015 04/14/13   Elson AreasLeslie K Sofia, PA-C  meloxicam (MOBIC) 7.5 MG tablet Take 1 tablet (7.5 mg total) by mouth daily. Patient not taking: Reported on 09/29/2015 04/14/13   Lonia SkinnerLeslie K  Sofia, PA-C   BP 117/62 mmHg  Pulse 69  Temp(Src) 98.2 F (36.8 C) (Oral)  Resp 18  SpO2 95% Physical Exam  Constitutional: She is oriented to person, place, and time. She appears well-developed and well-nourished.  HENT:  Head: Normocephalic and atraumatic.  Eyes: EOM are normal. Pupils are equal, round, and reactive to light.  Neck: Normal range of motion. Neck supple.  Cardiovascular: Normal rate, regular rhythm and normal heart sounds.  Exam reveals no gallop and no friction rub.   No murmur heard. Pulmonary/Chest: Effort normal and breath sounds normal. She has no wheezes.  Abdominal: Soft.  There is no tenderness. There is CVA tenderness.  Flank tenderness bilaterally  Musculoskeletal: Normal range of motion.  Lymphadenopathy:    She has no cervical adenopathy.  Neurological: She is alert and oriented to person, place, and time.  Skin: Skin is warm and dry.  Psychiatric: She has a normal mood and affect. Her behavior is normal.  Nursing note and vitals reviewed.   ED Course  Procedures (including critical care time) DIAGNOSTIC STUDIES: Oxygen Saturation is 97% on RA, normal by my interpretation.    COORDINATION OF CARE: 1:53 AM-Discussed treatment plan which includes chest x-ray and labs with pt at bedside and pt agreed to plan.    Labs Review Labs Reviewed  COMPREHENSIVE METABOLIC PANEL  BRAIN NATRIURETIC PEPTIDE  TROPONIN I  CBC WITH DIFFERENTIAL/PLATELET  PROTIME-INR    Imaging Review Dg Chest 2 View  09/29/2015  CLINICAL DATA:  Chronic cough, generalized weakness and fatigue. Body aches. Initial encounter. EXAM: CHEST  2 VIEW COMPARISON:  Chest radiograph performed 07/29/2015 FINDINGS: The lungs are well-aerated. Minimal right midlung and right basilar opacity raises concern for pneumonia. There is no evidence of pleural effusion or pneumothorax. The heart is normal in size; the mediastinal contour is within normal limits. No acute osseous abnormalities are seen. IMPRESSION: Minimal right midlung and right basilar airspace opacity raises concern for pneumonia. Electronically Signed   By: Roanna RaiderJeffery  Chang M.D.   On: 09/29/2015 03:00   Dg Foot Complete Left  09/29/2015  CLINICAL DATA:  Subacute onset of left foot pain. Initial encounter. EXAM: LEFT FOOT - COMPLETE 3+ VIEW COMPARISON:  None. FINDINGS: There is no evidence of fracture or dislocation. The joint spaces are preserved. There is no evidence of talar subluxation; the subtalar joint is unremarkable in appearance. A small posterior calcaneal spur is noted. No significant soft tissue abnormalities are seen.  IMPRESSION: No evidence of fracture or dislocation. Electronically Signed   By: Roanna RaiderJeffery  Chang M.D.   On: 09/29/2015 03:04   I have personally reviewed and evaluated these images and lab results as part of my medical decision-making.   EKG Interpretation None      MDM   Final diagnoses:  CAP (community acquired pneumonia)  Myalgia    Medical screening examination/treatment/procedure(s) were performed by me as the supervising physician. Scribe service was utilized for documentation only.  Pt comes in with cc of cough, body aches (back/flank and legs). Recent trip abroad for religious pilgrimage. Pt's cough started prior to her trip, and has gotten worse. Now having body aches, dib with exertion, fevers. DDX: PE and Pneumonia, viral syndrome. CXR ordered - if neg, we will get dimer.  Leg pain - no signs of infection, dvt, trauma.  Pt has hx of L foot fracture - pain getting worse, we will get xrays.    Derwood KaplanAnkit Marsela Kuan, MD 09/29/15 [phone]

## 2015-09-29 NOTE — Discharge Instructions (Signed)

## 2015-10-02 ENCOUNTER — Telehealth (HOSPITAL_BASED_OUTPATIENT_CLINIC_OR_DEPARTMENT_OTHER): Payer: Self-pay

## 2016-09-15 IMAGING — CR DG CHEST 2V
2 series · 2 of 2 positions shown · non-contrast
Comparison: 04/14/2013

CLINICAL DATA: Shortness of breath.  Headache.

EXAM:
CHEST  2 VIEW

[w chest pa]
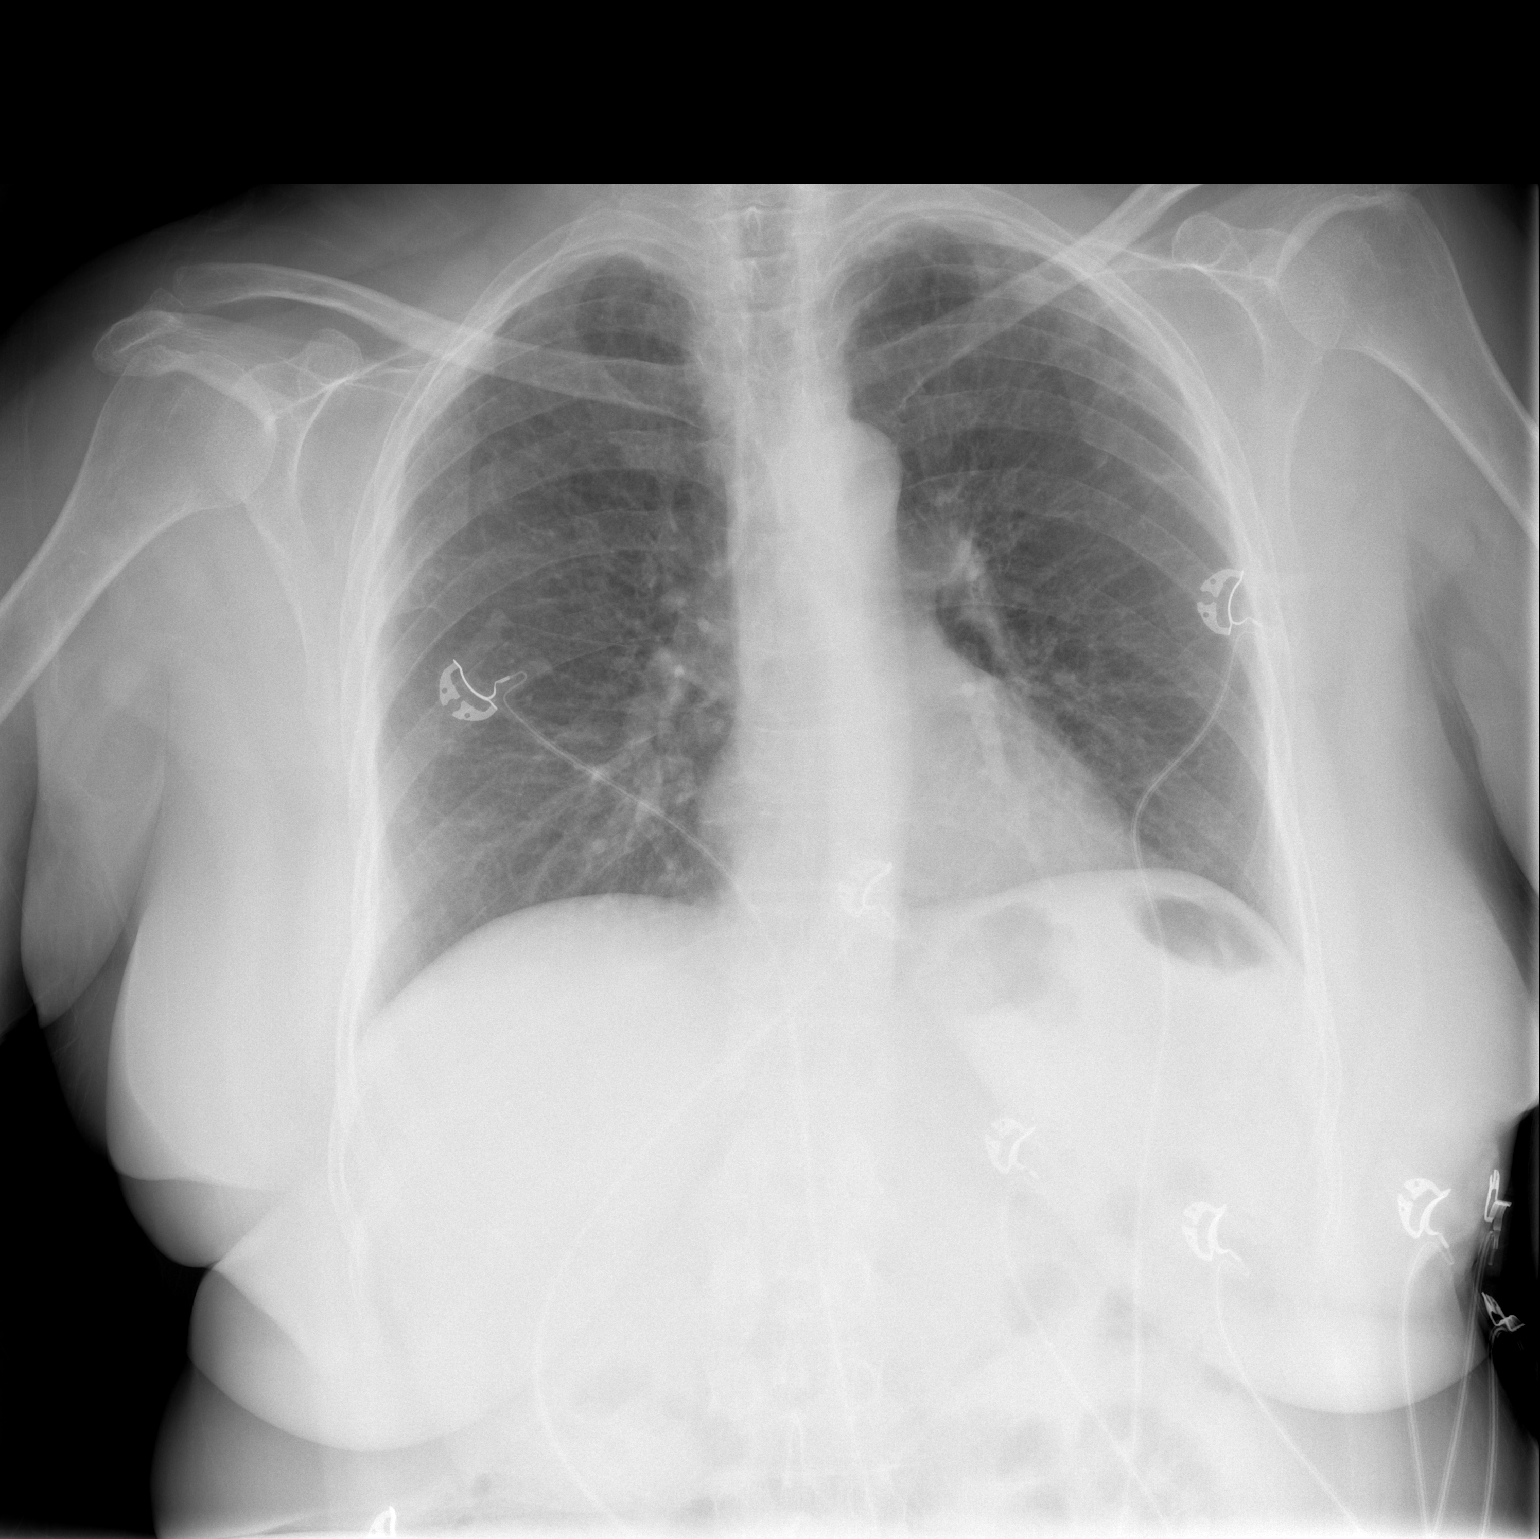

[w chest lat]
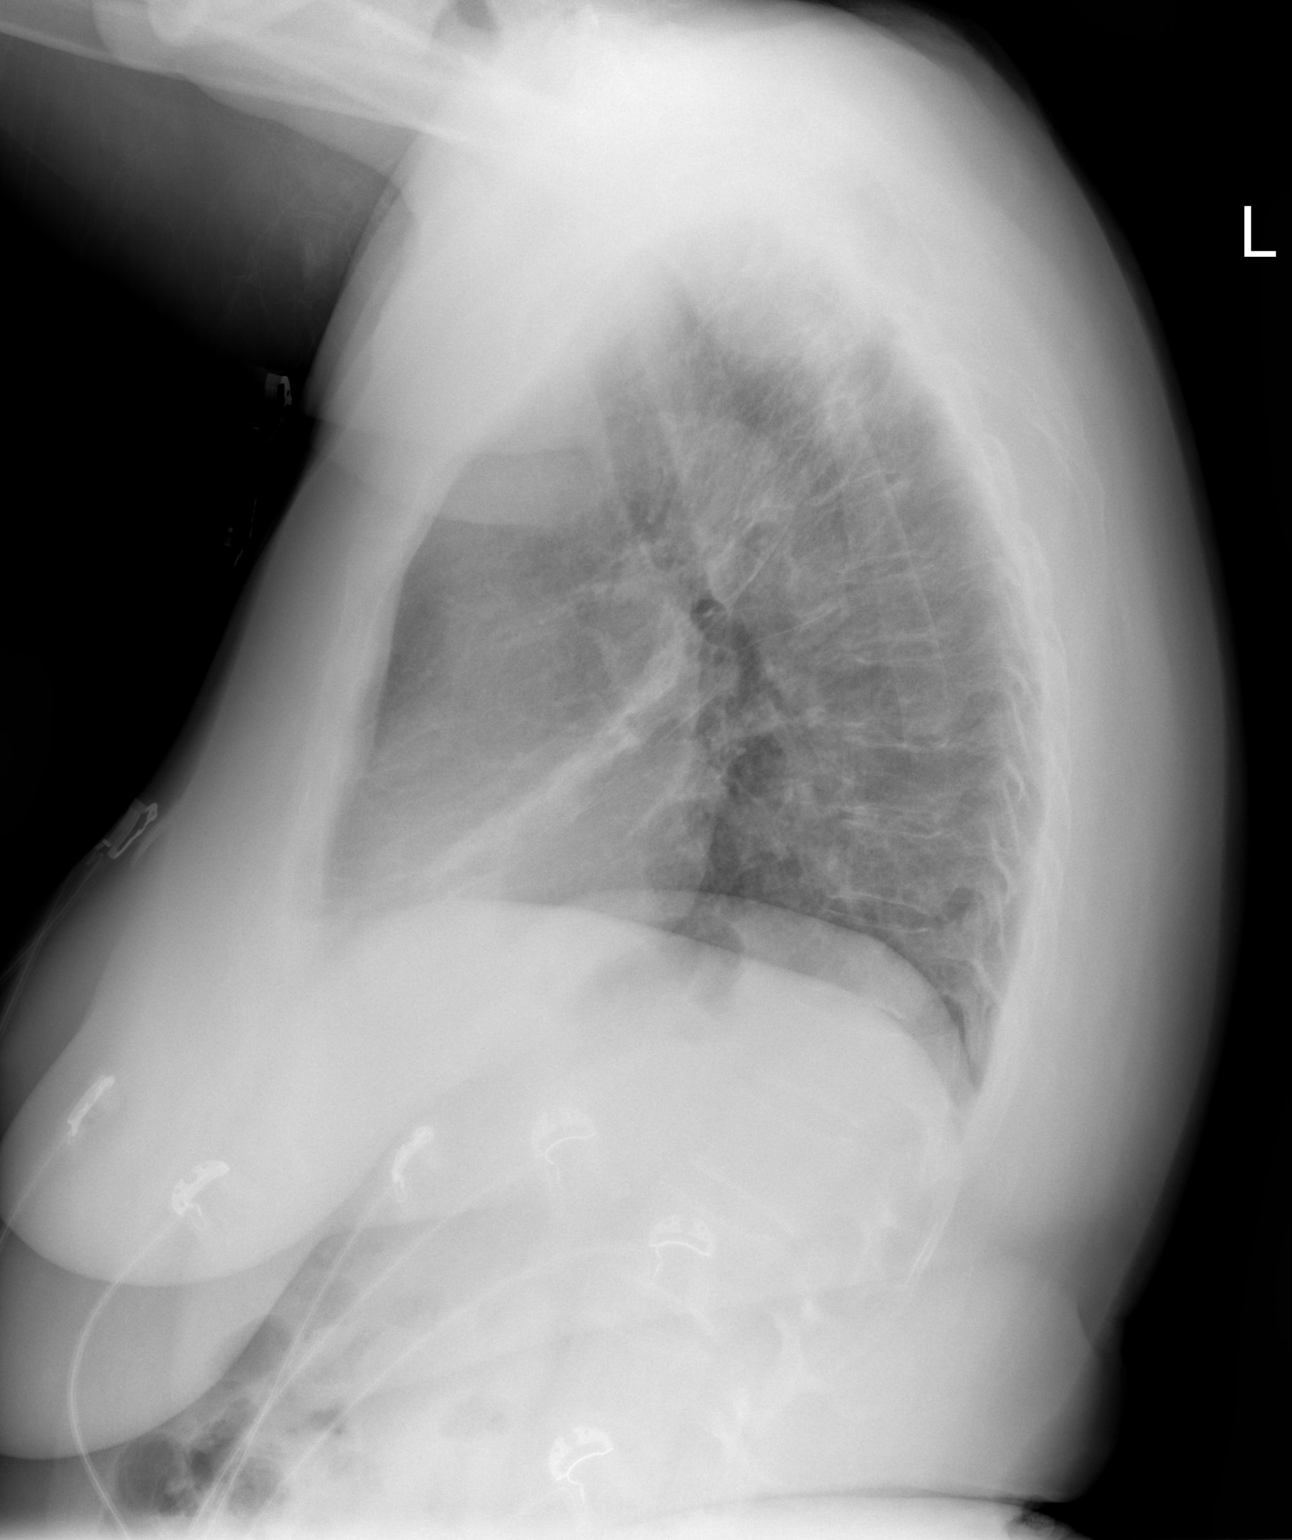

[2 of 2 positions shown; findings below may reference images not displayed]

FINDINGS: Patchy biapical pleural parenchymal scarring not appreciably changed
from the prior exam. The lungs appear otherwise clear.

Cardiac and mediastinal margins appear normal.

No pleural effusion.  Scarring in the lingula.
IMPRESSION: 1. Stable appearance of biapical pleural parenchymal scarring and
lingular scarring. No acute findings.

## 2019-01-27 ENCOUNTER — Institutional Professional Consult (permissible substitution): Payer: Medicare Other | Admitting: Internal Medicine
